# Patient Record
Sex: Male | Born: 1955 | Race: White | Hispanic: No | Marital: Single | Smoking: Current every day smoker
Health system: Southern US, Community
[De-identification: ages and names within clinical notes are randomized; demographics above are authoritative.]

## PROBLEM LIST (undated history)

## (undated) DIAGNOSIS — H905 Unspecified sensorineural hearing loss: Secondary | ICD-10-CM

## (undated) DIAGNOSIS — E785 Hyperlipidemia, unspecified: Secondary | ICD-10-CM

## (undated) DIAGNOSIS — M1990 Unspecified osteoarthritis, unspecified site: Secondary | ICD-10-CM

## (undated) HISTORY — DX: Hyperlipidemia, unspecified: E78.5

## (undated) HISTORY — DX: Unspecified osteoarthritis, unspecified site: M19.90

---

## 1998-04-29 HISTORY — PX: APPENDECTOMY: SHX54

## 1999-09-09 ENCOUNTER — Encounter: Payer: Self-pay | Admitting: *Deleted

## 1999-09-09 ENCOUNTER — Encounter (INDEPENDENT_AMBULATORY_CARE_PROVIDER_SITE_OTHER): Payer: Self-pay | Admitting: *Deleted

## 1999-09-09 ENCOUNTER — Inpatient Hospital Stay (HOSPITAL_COMMUNITY): Admission: EM | Admit: 1999-09-09 | Discharge: 1999-09-11 | Payer: Self-pay | Admitting: Emergency Medicine

## 2005-02-20 ENCOUNTER — Emergency Department (HOSPITAL_COMMUNITY): Admission: EM | Admit: 2005-02-20 | Discharge: 2005-02-20 | Payer: Self-pay | Admitting: Family Medicine

## 2007-07-26 ENCOUNTER — Emergency Department (HOSPITAL_COMMUNITY): Admission: EM | Admit: 2007-07-26 | Discharge: 2007-07-26 | Payer: Self-pay | Admitting: Emergency Medicine

## 2010-09-14 NOTE — H&P (Signed)
[name]. [facility]  Patient:    Antonio Carpenter, Antonio Carpenter                     MRN: [id number] Proc. Date: 09/09/99 Adm. Date:  09/09/99 Attending:  Sandria Bales. Ezzard Standing, M.D.                         History and Physical  DATE OF BIRTH:  ______ .  BRIEF HISTORY:  Mr. 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  He was from [place], [place] until he moved here last Fall.  The physician that he was seeing down there, died a year or two ago, and he has identified no physician.  Our communication was totally by handwritten notes, and I had nobody to interpret or sign.  According to the nurses, he never has signed for anybody around [place].  He apparently developed a lower abdominal pain earlier today.  A CT scan has revealed acute appendicitis.  As best I can tell, he has no history of gastrointestinal problems.  ALLERGIES:  He has no allergies.  CURRENT MEDICATIONS:  He is on no medications.  REVIEW OF SYSTEMS:  Basically unremarkable, though limited by what we can write in handwritten notes.  SOCIAL HISTORY:  He works for [employer].  PHYSICAL EXAMINATION:  VITAL SIGNS:  His temperature was 99.7.  It has been as high as 101.  His pulse was 88.  GENERAL:  He is a well-nourished, white male alert and cooperative.  HEENT:  Unremarkable.  He had lost his hearing as a child with a fever he said.  NECK:  Supple.  LUNGS:  Clear to auscultation.  HEART:  Regular rate and rhythm without murmur or rub.  ABDOMEN:  Bowel sounds present but slightly decreased.  He has tenderness in the right lower quadrant with guarding rebound consistent with appendicitis. He has no other mass or tenderness noted.  EXTREMITIES:  He moves extremities well.  LABORATORY DATA:  The labs that I have show a white blood count of 14,900, and 89% neutrophils.  His hemoglobin was 14.8, and hematocrit 43.3.   His electrolytes were all within normal limits.  IMPRESSION:  Acute appendicitis.  PLAN:  Appendectomy tonight.  I discussed with the patient.  I hand wrote a note that he needed an appendectomy, but really impractical until I explained to him all the pros and cons of the surgery. DD:  09/09/99 TD:  09/09/99 Job: [id number]

## 2010-09-14 NOTE — Op Note (Signed)
[place]  Patient:    Antonio Carpenter, MCMANAMAN                        MRN: [id number] Proc. Date: 09/10/99 Attending:  Sandria Bales. Ezzard Standing, M.D.                           Operative Report  PREOPERATIVE DIAGNOSIS:  Acute appendicitis.  POSTOPERATIVE DIAGNOSIS:  Acute appendicitis.  OPERATION:  Laparoscopic appendectomy.  SURGEON:  Sandria Bales. Ezzard Standing, M.D.  ANESTHESIA:  General endotracheal anesthesia.  ESTIMATED BLOOD LOSS:  Minimal.  INDICATION:  Mr. Sokolowski is a 55 year old deaf male who has no primary medical doctor.  He comes with a diagnosis of acute appendicitis.  Now comes for attempted laparoscopic appendectomy.  DESCRIPTION OF PROCEDURE:  The patient in the supine position with his arms strapped to his side.  Foley catheter in place.  Then 2 gm of cefotetan initiated at this procedure.  His abdomen was shaved, prepped with Betadine solution, and sterilely draped.  An infraumbilical incision was made with a 0 degree laparoscope inserted to a 12 mm Hasson trocar and then that was secured with a 0 Vicryl suture. Abdominal exploration revealed a gaseous bowel.  Liver unremarkable.  Two additional trocars were placed-a 5 mm Ethicon trocar in the right subcostal location and a 10 mm Ethicon trocar in the left lower quadrant location.  The appendix was identified and was noted to be down to the pelvic.  The brim was flipped up and I then was able to see down the mesentery to the appendix using a harmonic scalpel.  This was taken to the base of the appendix and stapled across the base of the appendix using the vascular staples of a 30 Endo-GIA.  The appendix was then removed.  The bag actually came apart.  I was able to get the appendix out intact without leaving any parts of the bag behind that I was aware of.  The portion was then removed.  He had bleeding from his left lower quadrant. I had to use an endocatch for one stitch of 2-0 Vicryl  suture.  The other two ports were then removed directly.  The skin was closed with a 5-0 Vicryl suture, painted with tinc of benzoin, Steri-Strips, and sterilely dressed.  The patient tolerated the procedure well and was transported to the recovery room in good condition. DD:  09/10/99 TD:  09/10/99 Job: [id number]

## 2013-06-23 ENCOUNTER — Ambulatory Visit: Payer: BC Managed Care – PPO

## 2013-06-23 ENCOUNTER — Ambulatory Visit: Payer: BC Managed Care – PPO | Attending: Orthopaedic Surgery | Admitting: Physical Therapy

## 2013-06-23 DIAGNOSIS — M542 Cervicalgia: Secondary | ICD-10-CM | POA: Insufficient documentation

## 2013-06-23 DIAGNOSIS — M6281 Muscle weakness (generalized): Secondary | ICD-10-CM | POA: Insufficient documentation

## 2013-06-23 DIAGNOSIS — IMO0001 Reserved for inherently not codable concepts without codable children: Secondary | ICD-10-CM | POA: Insufficient documentation

## 2013-06-30 ENCOUNTER — Ambulatory Visit: Payer: BC Managed Care – PPO | Attending: Orthopaedic Surgery

## 2013-06-30 DIAGNOSIS — M6281 Muscle weakness (generalized): Secondary | ICD-10-CM | POA: Diagnosis not present

## 2013-06-30 DIAGNOSIS — M542 Cervicalgia: Secondary | ICD-10-CM | POA: Diagnosis not present

## 2013-06-30 DIAGNOSIS — IMO0001 Reserved for inherently not codable concepts without codable children: Secondary | ICD-10-CM | POA: Diagnosis present

## 2013-07-02 ENCOUNTER — Ambulatory Visit: Payer: BC Managed Care – PPO

## 2013-07-02 DIAGNOSIS — IMO0001 Reserved for inherently not codable concepts without codable children: Secondary | ICD-10-CM | POA: Diagnosis not present

## 2013-07-05 ENCOUNTER — Ambulatory Visit: Payer: BC Managed Care – PPO | Admitting: Physical Therapy

## 2013-07-05 ENCOUNTER — Ambulatory Visit: Payer: BC Managed Care – PPO | Admitting: Rehabilitation

## 2013-07-05 DIAGNOSIS — IMO0001 Reserved for inherently not codable concepts without codable children: Secondary | ICD-10-CM | POA: Diagnosis not present

## 2013-07-07 ENCOUNTER — Other Ambulatory Visit (HOSPITAL_COMMUNITY): Payer: Self-pay | Admitting: Orthopaedic Surgery

## 2013-07-07 ENCOUNTER — Ambulatory Visit: Payer: BC Managed Care – PPO | Admitting: Physical Therapy

## 2013-07-07 DIAGNOSIS — IMO0001 Reserved for inherently not codable concepts without codable children: Secondary | ICD-10-CM | POA: Diagnosis not present

## 2013-07-12 ENCOUNTER — Ambulatory Visit: Payer: BC Managed Care – PPO | Admitting: Rehabilitation

## 2013-07-12 DIAGNOSIS — IMO0001 Reserved for inherently not codable concepts without codable children: Secondary | ICD-10-CM | POA: Diagnosis not present

## 2013-07-14 ENCOUNTER — Ambulatory Visit: Payer: BC Managed Care – PPO | Admitting: Rehabilitation

## 2013-07-14 DIAGNOSIS — IMO0001 Reserved for inherently not codable concepts without codable children: Secondary | ICD-10-CM | POA: Diagnosis not present

## 2013-07-19 ENCOUNTER — Ambulatory Visit: Payer: BC Managed Care – PPO | Admitting: Rehabilitation

## 2013-07-19 DIAGNOSIS — IMO0001 Reserved for inherently not codable concepts without codable children: Secondary | ICD-10-CM | POA: Diagnosis not present

## 2013-07-21 ENCOUNTER — Encounter: Payer: BC Managed Care – PPO | Admitting: Rehabilitation

## 2013-08-02 ENCOUNTER — Encounter (HOSPITAL_COMMUNITY)
Admission: RE | Admit: 2013-08-02 | Discharge: 2013-08-02 | Disposition: A | Discharge status: Home / Self Care | Payer: BC Managed Care – PPO | Source: Ambulatory Visit | Attending: Orthopaedic Surgery | Admitting: Orthopaedic Surgery

## 2013-08-02 ENCOUNTER — Encounter (HOSPITAL_COMMUNITY): Payer: Self-pay

## 2013-08-02 DIAGNOSIS — Z01818 Encounter for other preprocedural examination: Secondary | ICD-10-CM | POA: Insufficient documentation

## 2013-08-02 DIAGNOSIS — Z01812 Encounter for preprocedural laboratory examination: Secondary | ICD-10-CM | POA: Insufficient documentation

## 2013-08-02 DIAGNOSIS — Z01810 Encounter for preprocedural cardiovascular examination: Secondary | ICD-10-CM | POA: Insufficient documentation

## 2013-08-02 HISTORY — DX: Unspecified sensorineural hearing loss: H90.5

## 2013-08-02 LAB — COMPREHENSIVE METABOLIC PANEL
ALT: 15 U/L (ref 0–53)
AST: 15 U/L (ref 0–37)
Albumin: 4.1 g/dL (ref 3.5–5.2)
Alkaline Phosphatase: 80 U/L (ref 39–117)
BUN: 11 mg/dL (ref 6–23)
CO2: 25 mEq/L (ref 19–32)
Calcium: 9.1 mg/dL (ref 8.4–10.5)
Chloride: 102 mEq/L (ref 96–112)
Creatinine, Ser: 0.77 mg/dL (ref 0.50–1.35)
GFR calc Af Amer: >90 mL/min (ref >90)
GFR calc non Af Amer: >90 mL/min (ref >90)
Glucose, Bld: 109 mg/dL — ABNORMAL HIGH (ref 70–99)
Potassium: 4.1 mEq/L (ref 3.7–5.3)
Sodium: 143 mEq/L (ref 137–147)
Total Bilirubin: <0.2 mg/dL — ABNORMAL LOW (ref 0.3–1.2)
Total Protein: 7.4 g/dL (ref 6.0–8.3)

## 2013-08-02 LAB — URINALYSIS, ROUTINE W REFLEX MICROSCOPIC
Bilirubin Urine: NEGATIVE
Glucose, UA: NEGATIVE mg/dL
Hgb urine dipstick: NEGATIVE
Ketones, ur: NEGATIVE mg/dL
LEUKOCYTES UA: NEGATIVE
NITRITE: NEGATIVE
PROTEIN: NEGATIVE mg/dL
SPECIFIC GRAVITY, URINE: 1.017 (ref 1.005–1.030)
Urobilinogen, UA: 0.2 mg/dL (ref 0.0–1.0)
pH: 5 (ref 5.0–8.0)

## 2013-08-02 LAB — CBC
HCT: 39.5 % (ref 39.0–52.0)
Hemoglobin: 13.4 g/dL (ref 13.0–17.0)
MCH: 30.2 pg (ref 26.0–34.0)
MCHC: 33.9 g/dL (ref 30.0–36.0)
MCV: 89 fL (ref 78.0–100.0)
Platelets: 374 10*3/uL (ref 150–400)
RBC: 4.44 MIL/uL (ref 4.22–5.81)
RDW: 15 % (ref 11.5–15.5)
WBC: 9.2 10*3/uL (ref 4.0–10.5)

## 2013-08-02 LAB — PROTIME-INR
INR: 0.87 (ref 0.00–1.49)
PROTHROMBIN TIME: 11.7 s (ref 11.6–15.2)

## 2013-08-02 LAB — SURGICAL PCR SCREEN
MRSA, PCR: NEGATIVE
Staphylococcus aureus: NEGATIVE

## 2013-08-02 NOTE — Progress Notes (Signed)
Patient's PAT appointment was completed with assistance of sign language interpreter.

## 2013-08-02 NOTE — Progress Notes (Signed)
Primary physician - dr.  Merideth Abbey Erenest Blank) Does not have cardiologist Had stress test more than 10 years ago - was normal no testing since then

## 2013-08-02 NOTE — Pre-Procedure Instructions (Signed)
Antonio Carpenter  08/02/2013   Your procedure is scheduled on:  Wednesday, April 15th  Report to Admitting at 1030 AM.  Call this number if you have problems the morning of surgery: [phone]   Remember:   Do not eat food or drink liquids after midnight.   Take these medicines the morning of surgery with A SIP OF WATER:    Do not wear jewelry.  Do not wear lotions, powders, or perfumes. You may wear deodorant.  Do not shave 48 hours prior to surgery. Men may shave face and neck.  Do not bring valuables to the hospital.  [facility] is not responsible   for any belongings or valuables.               Contacts, dentures or bridgework may not be worn into surgery.  Leave suitcase in the car. After surgery it may be brought to your room.  For patients admitted to the hospital, discharge time is determined by your  treatment team.               Patients discharged the day of surgery will not be allowed to drive home.  Please read over the following fact sheets that you were given: Pain Booklet, Coughing and Deep Breathing, MRSA Information and Surgical Site Infection Prevention [facility] - Preparing for Surgery  Before surgery, you can play an important role.  Because skin is not sterile, your skin needs to be as free of germs as possible.  You can reduce the number of germs on you skin by washing with CHG (chlorahexidine gluconate) soap before surgery.  CHG is an antiseptic cleaner which kills germs and bonds with the skin to continue killing germs even after washing.  Please DO NOT use if you have an allergy to CHG or antibacterial soaps.  If your skin becomes reddened/irritated stop using the CHG and inform your nurse when you arrive at Short Stay.  Do not shave (including legs and underarms) for at least 48 hours prior to the first CHG shower.  You may shave your face.  Please follow these instructions carefully:   1.  Shower with CHG Soap the night before surgery and the morning of  Surgery.  2.  If you choose to wash your hair, wash your hair first as usual with your normal shampoo.  3.  After you shampoo, rinse your hair and body thoroughly to remove the shampoo.  4.  Use CHG as you would any other liquid soap.  You can apply CHG directly to the skin and wash gently with scrungie or a clean washcloth.  5.  Apply the CHG Soap to your body ONLY FROM THE NECK DOWN.  Do not use on open wounds or open sores.  Avoid contact with your eyes, ears, mouth and genitals (private parts).  Wash genitals (private parts) with your normal soap.  6.  Wash thoroughly, paying special attention to the area where your surgery will be performed.  7.  Thoroughly rinse your body with warm water from the neck down.  8.  DO NOT shower/wash with your normal soap after using and rinsing off the CHG Soap.  9.  Pat yourself dry with a clean towel.            10.  Wear clean pajamas.            11.  Place clean sheets on your bed the night of your first shower and do not sleep with  pets.  Day of Surgery  Do not apply any lotions/deoderants the morning of surgery.  Please wear clean clothes to the hospital/surgery center.

## 2013-08-06 NOTE — H&P (Signed)
[facility]   A Division of [employer]   [address] [place], [place] [address] Telephone: [phone]  Fax: [phone]     PATIENT: Antonio Carpenter, Antonio Carpenter   MR#: [id number]  DOB: 04/12/1956       A 58 year old returns with persistent problems with cervical spine pain.  He has problems with bilateral hand stiffness.  He has more numbness in the radial fingers than the ulnar.  Continues to have problems with neck pain.  He states the hands feel weak enough that he has not been able to work as planned.  He has gotten some temporary relief when he took the Medrol Dosepak repetitively.  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  There is some additional foraminal narrowing at C4-5 and C6-7, moderate to severe at C6-7 on the left, but his symptoms have primarily been bilateral arm numbness and tingling with difficulty with gripping.  He has been through therapy, has had several months of modified activity, and has not gotten better.  He denies any lower extremity numbness or tingling problems, only upper extremities, neck, and his shoulders and hands.   PAST SURGICAL HISTORY:  Includes appendectomy 2008.   SOCIAL HISTORY:  Patient is single, works in an [employer].  He is a 1/2-pack-per-day smoker x20 years.  Does not drink.  He is deaf.   PHYSICAL EXAMINATION:  Patient is 5 feet 7 inches, 180 pounds.  Alert and oriented. Heart and lung sounds clear.  Abdomen soft .  Bilateral brachial plexus tenderness, both right and left.  He has 2+ biceps and triceps reflexes, mild brachial plexus tenderness.  Interossei are normal.  He lacks about 1 fingerbreadth touching fingertips to palm symmetrically with stiffness.  No triggering over the A1 pulley.  Mild DIP osteophyte formation of the digits.   We reviewed patient's MRI once again.  This shows  his tightest level is at the C3-4 level.  He has a straight cervical spine with loss of lordosis and anterior spurring at C5-6 and C6-7.  Patient did well with the Medrol Dosepak for a short, temporary time and then had recurrence of symptoms.   PLAN:  The plan would be a single-level diskectomy C3-4, anterior cervical diskectomy and fusion, allograft and plate.  Eight weeks out of work.  All questions answered.  Risks of surgery include dysphagia, dysphonia, hoarseness, re-operation, pseudarthrosis.       Mark C. Lorin Mercy, M.D.    Auto-Authenticated by Thana Farr. Lorin Mercy, M.D.

## 2013-08-10 MED ORDER — CEFAZOLIN SODIUM-DEXTROSE 2-3 GM-% IV SOLR
2.0000 g | INTRAVENOUS | Status: AC
  Administered 2013-08-11: 2 g via INTRAVENOUS
  Filled 2013-08-10: qty 50

## 2013-08-11 ENCOUNTER — Inpatient Hospital Stay (HOSPITAL_COMMUNITY)
Admission: RE | Admit: 2013-08-11 | Discharge: 2013-08-12 | DRG: 473 | Disposition: A | Discharge status: Home / Self Care | Payer: BC Managed Care – PPO | Source: Ambulatory Visit | Attending: Orthopaedic Surgery | Admitting: Orthopaedic Surgery

## 2013-08-11 ENCOUNTER — Encounter (HOSPITAL_COMMUNITY)
Admission: RE | Disposition: A | Discharge status: Home / Self Care | Payer: Self-pay | Source: Ambulatory Visit | Attending: Orthopaedic Surgery

## 2013-08-11 ENCOUNTER — Ambulatory Visit (HOSPITAL_COMMUNITY): Payer: BC Managed Care – PPO | Admitting: Anesthesiology

## 2013-08-11 ENCOUNTER — Encounter (HOSPITAL_COMMUNITY): Payer: Self-pay | Admitting: *Deleted

## 2013-08-11 ENCOUNTER — Ambulatory Visit (HOSPITAL_COMMUNITY): Payer: BC Managed Care – PPO

## 2013-08-11 ENCOUNTER — Encounter (HOSPITAL_COMMUNITY): Payer: BC Managed Care – PPO | Admitting: Anesthesiology

## 2013-08-11 DIAGNOSIS — M47812 Spondylosis without myelopathy or radiculopathy, cervical region: Secondary | ICD-10-CM | POA: Diagnosis present

## 2013-08-11 DIAGNOSIS — F17200 Nicotine dependence, unspecified, uncomplicated: Secondary | ICD-10-CM | POA: Diagnosis present

## 2013-08-11 DIAGNOSIS — M5020 Other cervical disc displacement, unspecified cervical region: Secondary | ICD-10-CM | POA: Diagnosis present

## 2013-08-11 DIAGNOSIS — H9190 Unspecified hearing loss, unspecified ear: Secondary | ICD-10-CM | POA: Diagnosis present

## 2013-08-11 HISTORY — PX: ANTERIOR CERVICAL DECOMP/DISCECTOMY FUSION: SHX1161

## 2013-08-11 SURGERY — ANTERIOR CERVICAL DECOMPRESSION/DISCECTOMY FUSION 1 LEVEL
Anesthesia: General | Site: Neck

## 2013-08-11 MED ORDER — FLEET ENEMA 7-19 GM/118ML RE ENEM: 1.0000 | ENEMA | Freq: Once | RECTAL | Status: AC | PRN

## 2013-08-11 MED ORDER — METHOCARBAMOL 500 MG PO TABS: 500.0000 mg | ORAL_TABLET | Freq: Four times a day (QID) | ORAL | Status: DC | PRN

## 2013-08-11 MED ORDER — PROPOFOL 10 MG/ML IV BOLUS
INTRAVENOUS | Status: DC | PRN
  Administered 2013-08-11: 200 mg via INTRAVENOUS

## 2013-08-11 MED ORDER — KETOROLAC TROMETHAMINE 30 MG/ML IJ SOLN
30.0000 mg | Freq: Once | INTRAMUSCULAR | Status: AC
  Administered 2013-08-11: 30 mg via INTRAVENOUS
  Filled 2013-08-11: qty 1

## 2013-08-11 MED ORDER — KCL IN DEXTROSE-NACL 20-5-0.45 MEQ/L-%-% IV SOLN
INTRAVENOUS | Status: DC
  Administered 2013-08-11: 75 mL/h via INTRAVENOUS
  Filled 2013-08-11 (×3): qty 1000

## 2013-08-11 MED ORDER — HYDROMORPHONE HCL PF 1 MG/ML IJ SOLN
0.2500 mg | INTRAMUSCULAR | Status: DC | PRN
  Administered 2013-08-11 (×2): 0.5 mg via INTRAVENOUS

## 2013-08-11 MED ORDER — SODIUM CHLORIDE 0.9 % IJ SOLN
3.0000 mL | Freq: Two times a day (BID) | INTRAMUSCULAR | Status: DC
  Administered 2013-08-11: 3 mL via INTRAVENOUS

## 2013-08-11 MED ORDER — LIDOCAINE HCL (CARDIAC) 20 MG/ML IV SOLN
INTRAVENOUS | Status: DC | PRN
  Administered 2013-08-11: 60 mg via INTRAVENOUS

## 2013-08-11 MED ORDER — ACETAMINOPHEN 650 MG RE SUPP: 650.0000 mg | RECTAL | Status: DC | PRN

## 2013-08-11 MED ORDER — HYDROCODONE-ACETAMINOPHEN 5-325 MG PO TABS
1.0000 | ORAL_TABLET | ORAL | Status: DC | PRN
  Administered 2013-08-11 – 2013-08-12 (×2): 2 via ORAL
  Filled 2013-08-11 (×2): qty 2

## 2013-08-11 MED ORDER — OXYCODONE HCL 5 MG/5ML PO SOLN: 5.0000 mg | Freq: Once | ORAL | Status: DC | PRN

## 2013-08-11 MED ORDER — PHENOL 1.4 % MT LIQD: 1.0000 | OROMUCOSAL | Status: DC | PRN

## 2013-08-11 MED ORDER — LACTATED RINGERS IV SOLN
INTRAVENOUS | Status: DC
  Administered 2013-08-11: 11:00:00 via INTRAVENOUS

## 2013-08-11 MED ORDER — OXYCODONE HCL 5 MG PO TABS: 5.0000 mg | ORAL_TABLET | Freq: Once | ORAL | Status: DC | PRN

## 2013-08-11 MED ORDER — METHOCARBAMOL 100 MG/ML IJ SOLN
500.0000 mg | Freq: Four times a day (QID) | INTRAVENOUS | Status: DC | PRN
  Filled 2013-08-11: qty 5

## 2013-08-11 MED ORDER — HEMOSTATIC AGENTS (NO CHARGE) OPTIME
TOPICAL | Status: DC | PRN
  Administered 2013-08-11: 1 via TOPICAL

## 2013-08-11 MED ORDER — METHOCARBAMOL 500 MG PO TABS: 500.0000 mg | ORAL_TABLET | Freq: Four times a day (QID) | ORAL | Status: AC | PRN

## 2013-08-11 MED ORDER — OXYCODONE-ACETAMINOPHEN 5-325 MG PO TABS: 1.0000 | ORAL_TABLET | ORAL | Status: AC | PRN

## 2013-08-11 MED ORDER — FENTANYL CITRATE 0.05 MG/ML IJ SOLN
INTRAMUSCULAR | Status: DC | PRN
  Administered 2013-08-11 (×2): 50 ug via INTRAVENOUS
  Administered 2013-08-11: 150 ug via INTRAVENOUS

## 2013-08-11 MED ORDER — PROPOFOL 10 MG/ML IV BOLUS
INTRAVENOUS | Status: AC
  Filled 2013-08-11: qty 20

## 2013-08-11 MED ORDER — ACETAMINOPHEN 325 MG PO TABS: 650.0000 mg | ORAL_TABLET | ORAL | Status: DC | PRN

## 2013-08-11 MED ORDER — LACTATED RINGERS IV SOLN
INTRAVENOUS | Status: DC | PRN
  Administered 2013-08-11: 11:00:00 via INTRAVENOUS

## 2013-08-11 MED ORDER — BUPIVACAINE-EPINEPHRINE (PF) 0.5% -1:200000 IJ SOLN
INTRAMUSCULAR | Status: AC
  Filled 2013-08-11: qty 10

## 2013-08-11 MED ORDER — ROCURONIUM BROMIDE 100 MG/10ML IV SOLN
INTRAVENOUS | Status: DC | PRN
  Administered 2013-08-11: 50 mg via INTRAVENOUS
  Administered 2013-08-11: 10 mg via INTRAVENOUS

## 2013-08-11 MED ORDER — FENTANYL CITRATE 0.05 MG/ML IJ SOLN
INTRAMUSCULAR | Status: AC
  Filled 2013-08-11: qty 5

## 2013-08-11 MED ORDER — 0.9 % SODIUM CHLORIDE (POUR BTL) OPTIME
TOPICAL | Status: DC | PRN
  Administered 2013-08-11: 1000 mL

## 2013-08-11 MED ORDER — LIDOCAINE HCL (CARDIAC) 20 MG/ML IV SOLN
INTRAVENOUS | Status: AC
  Filled 2013-08-11: qty 5

## 2013-08-11 MED ORDER — MIDAZOLAM HCL 2 MG/2ML IJ SOLN
INTRAMUSCULAR | Status: AC
  Filled 2013-08-11: qty 2

## 2013-08-11 MED ORDER — PHENYLEPHRINE HCL 10 MG/ML IJ SOLN
10.0000 mg | INTRAVENOUS | Status: DC | PRN
  Administered 2013-08-11: 10 ug/min via INTRAVENOUS

## 2013-08-11 MED ORDER — THROMBIN 5000 UNITS EX SOLR
CUTANEOUS | Status: AC
  Filled 2013-08-11: qty 5000

## 2013-08-11 MED ORDER — THROMBIN 5000 UNITS EX SOLR
CUTANEOUS | Status: DC | PRN
  Administered 2013-08-11: 5000 [IU] via TOPICAL

## 2013-08-11 MED ORDER — PROMETHAZINE HCL 25 MG/ML IJ SOLN: 6.2500 mg | INTRAMUSCULAR | Status: DC | PRN

## 2013-08-11 MED ORDER — ONDANSETRON HCL 4 MG/2ML IJ SOLN: 4.0000 mg | INTRAMUSCULAR | Status: DC | PRN

## 2013-08-11 MED ORDER — NEOSTIGMINE METHYLSULFATE 1 MG/ML IJ SOLN
INTRAMUSCULAR | Status: DC | PRN
  Administered 2013-08-11: 4 mg via INTRAVENOUS

## 2013-08-11 MED ORDER — GLYCOPYRROLATE 0.2 MG/ML IJ SOLN
INTRAMUSCULAR | Status: DC | PRN
  Administered 2013-08-11: 0.6 mg via INTRAVENOUS

## 2013-08-11 MED ORDER — MIDAZOLAM HCL 5 MG/5ML IJ SOLN
INTRAMUSCULAR | Status: DC | PRN
  Administered 2013-08-11: 2 mg via INTRAVENOUS

## 2013-08-11 MED ORDER — HYDROMORPHONE HCL PF 1 MG/ML IJ SOLN
INTRAMUSCULAR | Status: AC
  Administered 2013-08-11: 0.5 mg via INTRAVENOUS
  Filled 2013-08-11: qty 1

## 2013-08-11 MED ORDER — MORPHINE SULFATE 2 MG/ML IJ SOLN: 1.0000 mg | INTRAMUSCULAR | Status: DC | PRN

## 2013-08-11 MED ORDER — SODIUM CHLORIDE 0.9 % IJ SOLN
3.0000 mL | INTRAMUSCULAR | Status: DC | PRN
  Administered 2013-08-11: 3 mL via INTRAVENOUS

## 2013-08-11 MED ORDER — PANTOPRAZOLE SODIUM 40 MG IV SOLR
40.0000 mg | Freq: Every day | INTRAVENOUS | Status: DC
  Administered 2013-08-11: 40 mg via INTRAVENOUS
  Filled 2013-08-11 (×2): qty 40

## 2013-08-11 MED ORDER — MENTHOL 3 MG MT LOZG
1.0000 | LOZENGE | OROMUCOSAL | Status: DC | PRN
  Filled 2013-08-11 (×3): qty 9

## 2013-08-11 MED ORDER — THROMBIN 20000 UNITS EX KIT
PACK | CUTANEOUS | Status: AC
  Filled 2013-08-11: qty 1

## 2013-08-11 MED ORDER — SODIUM CHLORIDE 0.9 % IV SOLN: 250.0000 mL | INTRAVENOUS | Status: DC

## 2013-08-11 MED ORDER — DOCUSATE SODIUM 100 MG PO CAPS
100.0000 mg | ORAL_CAPSULE | Freq: Two times a day (BID) | ORAL | Status: DC
  Administered 2013-08-11: 100 mg via ORAL
  Filled 2013-08-11 (×3): qty 1

## 2013-08-11 MED ORDER — ONDANSETRON HCL 4 MG/2ML IJ SOLN
INTRAMUSCULAR | Status: DC | PRN
  Administered 2013-08-11: 4 mg via INTRAVENOUS

## 2013-08-11 MED ORDER — OXYCODONE-ACETAMINOPHEN 5-325 MG PO TABS
1.0000 | ORAL_TABLET | ORAL | Status: DC | PRN
  Administered 2013-08-12: 2 via ORAL
  Filled 2013-08-11: qty 2

## 2013-08-11 MED ORDER — BISACODYL 10 MG RE SUPP: 10.0000 mg | Freq: Every day | RECTAL | Status: DC | PRN

## 2013-08-11 MED ORDER — SENNOSIDES-DOCUSATE SODIUM 8.6-50 MG PO TABS: 1.0000 | ORAL_TABLET | Freq: Every evening | ORAL | Status: DC | PRN

## 2013-08-11 MED ORDER — PHENYLEPHRINE HCL 10 MG/ML IJ SOLN
INTRAMUSCULAR | Status: DC | PRN
  Administered 2013-08-11: 40 ug via INTRAVENOUS

## 2013-08-11 SURGICAL SUPPLY — 63 items
ADH SKN CLS APL DERMABOND .7 (GAUZE/BANDAGES/DRESSINGS) ×1
APL SKNCLS STERI-STRIP NONHPOA (GAUZE/BANDAGES/DRESSINGS) ×1
BENZOIN TINCTURE PRP APPL 2/3 (GAUZE/BANDAGES/DRESSINGS) ×2 IMPLANT
BIT DRILL SRG 14X2.2XFLT CHK (BIT) IMPLANT
BIT DRL SRG 14X2.2XFLT CHK (BIT) ×1
BLADE SURG ROTATE 9660 (MISCELLANEOUS) IMPLANT
BONE CERV LORDOTIC 14.5X12X7 (Bone Implant) ×3 IMPLANT
BUR ROUND FLUTED 4 SOFT TCH (BURR) ×1 IMPLANT
BUR ROUND FLUTED 4MM SOFT TCH (BURR) ×1
CLOSURE STERI-STRIP 1/2X4 (GAUZE/BANDAGES/DRESSINGS) ×1
CLSR STERI-STRIP ANTIMIC 1/2X4 (GAUZE/BANDAGES/DRESSINGS) ×1 IMPLANT
COLLAR CERV LO CONTOUR FIRM DE (SOFTGOODS) ×3 IMPLANT
CORDS BIPOLAR (ELECTRODE) IMPLANT
COVER MAYO STAND STRL (DRAPES) ×3 IMPLANT
COVER SURGICAL LIGHT HANDLE (MISCELLANEOUS) ×3 IMPLANT
DERMABOND ADVANCED (GAUZE/BANDAGES/DRESSINGS) ×2
DERMABOND ADVANCED .7 DNX12 (GAUZE/BANDAGES/DRESSINGS) ×1 IMPLANT
DRAPE C-ARM 42X72 X-RAY (DRAPES) ×3 IMPLANT
DRAPE MICROSCOPE LEICA (MISCELLANEOUS) ×3 IMPLANT
DRAPE PROXIMA HALF (DRAPES) ×3 IMPLANT
DRILL BIT SKYLINE 14MM (BIT) ×3
DRSG MEPILEX BORDER 4X4 (GAUZE/BANDAGES/DRESSINGS) ×3 IMPLANT
DRSG MEPILEX BORDER 4X8 (GAUZE/BANDAGES/DRESSINGS) ×3 IMPLANT
DURAPREP 6ML APPLICATOR 50/CS (WOUND CARE) ×3 IMPLANT
ELECT COATED BLADE 2.86 ST (ELECTRODE) ×3 IMPLANT
ELECT REM PT RETURN 9FT ADLT (ELECTROSURGICAL) ×3
ELECTRODE REM PT RTRN 9FT ADLT (ELECTROSURGICAL) ×1 IMPLANT
EVACUATOR 1/8 PVC DRAIN (DRAIN) ×3 IMPLANT
GAUZE XEROFORM 1X8 LF (GAUZE/BANDAGES/DRESSINGS) ×6 IMPLANT
GLOVE BIOGEL PI IND STRL 7.5 (GLOVE) ×1 IMPLANT
GLOVE BIOGEL PI IND STRL 8 (GLOVE) ×1 IMPLANT
GLOVE BIOGEL PI INDICATOR 7.5 (GLOVE) ×2
GLOVE BIOGEL PI INDICATOR 8 (GLOVE) ×2
GLOVE ECLIPSE 7.0 STRL STRAW (GLOVE) ×3 IMPLANT
GLOVE ORTHO TXT STRL SZ7.5 (GLOVE) ×3 IMPLANT
GOWN STRL REUS W/ TWL LRG LVL3 (GOWN DISPOSABLE) ×2 IMPLANT
GOWN STRL REUS W/ TWL XL LVL3 (GOWN DISPOSABLE) ×1 IMPLANT
GOWN STRL REUS W/TWL LRG LVL3 (GOWN DISPOSABLE) ×6
GOWN STRL REUS W/TWL XL LVL3 (GOWN DISPOSABLE) ×3
GRAFT BNE SPCR VG2 14.5X12X7 (Bone Implant) IMPLANT
HEAD HALTER (SOFTGOODS) ×3 IMPLANT
HEMOSTAT SURGICEL 2X14 (HEMOSTASIS) IMPLANT
KIT BASIN OR (CUSTOM PROCEDURE TRAY) ×3 IMPLANT
KIT ROOM TURNOVER OR (KITS) ×3 IMPLANT
MANIFOLD NEPTUNE II (INSTRUMENTS) ×3 IMPLANT
NDL 25GX 5/8IN NON SAFETY (NEEDLE) ×1 IMPLANT
NEEDLE 25GX 5/8IN NON SAFETY (NEEDLE) ×3 IMPLANT
NS IRRIG 1000ML POUR BTL (IV SOLUTION) ×3 IMPLANT
PACK ORTHO CERVICAL (CUSTOM PROCEDURE TRAY) ×3 IMPLANT
PAD ARMBOARD 7.5X6 YLW CONV (MISCELLANEOUS) ×6 IMPLANT
PATTIES SURGICAL .5 X.5 (GAUZE/BANDAGES/DRESSINGS) IMPLANT
PLATE ONE LEVEL SKYLINE 14MM (Plate) ×2 IMPLANT
SCREW VAR SELF TAP SKYLINE 14M (Screw) ×8 IMPLANT
SPONGE GAUZE 4X4 12PLY (GAUZE/BANDAGES/DRESSINGS) ×3 IMPLANT
SPONGE SURGIFOAM ABS GEL 100 (HEMOSTASIS) ×2 IMPLANT
SURGIFLO TRUKIT (HEMOSTASIS) IMPLANT
SUT VIC AB 3-0 X1 27 (SUTURE) ×3 IMPLANT
SUT VICRYL 4-0 PS2 18IN ABS (SUTURE) ×6 IMPLANT
SYR 30ML SLIP (SYRINGE) ×3 IMPLANT
SYR BULB 3OZ (MISCELLANEOUS) ×3 IMPLANT
TOWEL OR 17X24 6PK STRL BLUE (TOWEL DISPOSABLE) ×3 IMPLANT
TOWEL OR 17X26 10 PK STRL BLUE (TOWEL DISPOSABLE) ×3 IMPLANT
WATER STERILE IRR 1000ML POUR (IV SOLUTION) ×3 IMPLANT

## 2013-08-11 NOTE — Discharge Instructions (Signed)
No lifting greater than 10 lbs. No overhead use of arms. °Avoid bending,and twisting neck. °Walk in house for first week them may start to get out slowly increasing distance up to one mile by 3 weeks post op. °Keep incision dry for 3 days, may then bathe and wet incision using a covered collar when showering. °Call if any fevers >101, chills, or increasing numbness or weakness or increased swelling or drainage. ° °

## 2013-08-11 NOTE — Progress Notes (Signed)
Pt is totally deaf sign language lady here with pt to ask pt questions and explain to pt

## 2013-08-11 NOTE — Anesthesia Postprocedure Evaluation (Signed)
Anesthesia Post Note  Patient: Antonio Carpenter  Procedure(s) Performed: Procedure(s) (LRB): ANTERIOR CERVICAL DECOMPRESSION/DISCECTOMY FUSION 1 LEVEL (N/A)  Anesthesia type: general  Patient location: PACU  Post pain: Pain level controlled  Post assessment: Patient's Cardiovascular Status Stable  Last Vitals:  Filed Vitals:   08/11/13 1515  BP: 135/85  Pulse: 66  Temp: 36.8 C  Resp: 11    Post vital signs: Reviewed and stable  Level of consciousness: sedated  Complications: No apparent anesthesia complications

## 2013-08-11 NOTE — Transfer of Care (Signed)
Immediate Anesthesia Transfer of Care Note  Patient: Antonio Carpenter  Procedure(s) Performed: Procedure(s) with comments: ANTERIOR CERVICAL DECOMPRESSION/DISCECTOMY FUSION 1 LEVEL (N/A) - C3-4 Anterior Cervical Discectomy and Fusion, Allograft and Plate  Patient Location: PACU  Anesthesia Type:General  Level of Consciousness: awake  Airway & Oxygen Therapy: Patient Spontanous Breathing and Patient connected to nasal cannula oxygen  Post-op Assessment: Report given to PACU RN, Post -op Vital signs reviewed and stable and Patient moving all extremities  Post vital signs: Reviewed and stable  Complications: No apparent anesthesia complications

## 2013-08-11 NOTE — Anesthesia Procedure Notes (Addendum)
Procedure Name: Intubation Date/Time: 08/11/2013 12:40 PM Performed by: Maeola Harman Pre-anesthesia Checklist: Patient identified, Emergency Drugs available, Patient being monitored, Suction available and Timeout performed Patient Re-evaluated:Patient Re-evaluated prior to inductionOxygen Delivery Method: Circle system utilized Preoxygenation: Pre-oxygenation with 100% oxygen Intubation Type: IV induction Ventilation: Mask ventilation without difficulty and Oral airway inserted - appropriate to patient size Laryngoscope Size: Mac and 3 Grade View: Grade I Tube type: Oral Tube size: 7.5 mm Number of attempts: 1 Airway Equipment and Method: Stylet Placement Confirmation: ETT inserted through vocal cords under direct vision,  positive ETCO2 and breath sounds checked- equal and bilateral Secured at: 22 cm Tube secured with: Tape Dental Injury: Teeth and Oropharynx as per pre-operative assessment  Comments: Easy atraumatic induction and intubation with MAC 3 blade.  Dr. Tobias Alexander verified placement of ETT.  Antonio Session, CRNA

## 2013-08-11 NOTE — Interval H&P Note (Signed)
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 After consideration of risks, benefits and other options for treatment, the patient has consented to  Procedure(s) with comments: ANTERIOR CERVICAL DECOMPRESSION/DISCECTOMY FUSION 1 LEVEL (N/A) - C3-4 Anterior Cervical Discectomy and Fusion, Allograft and Plate as a surgical intervention .  The patient's history has been reviewed, patient examined, no change in status, stable for surgery.  I have reviewed the patient's chart and labs.  Questions were answered to the patient's satisfaction.     Marybelle Killings

## 2013-08-11 NOTE — Progress Notes (Signed)
Orthopedic Tech Progress Note Patient Details:  Antonio Carpenter [birth year] [id number] Patient has soft collar Patient ID: Antonio Carpenter, male   DOB: 12/29/1955, 58 y.o.   MRN: [id number]   [name] 08/11/2013, 4:56 PM

## 2013-08-11 NOTE — Progress Notes (Signed)
Utilization review completed.  

## 2013-08-11 NOTE — Progress Notes (Signed)
Paper and pen at patient bedside for communication.  Interpreter will be at bedside at 7am in the morning when MD makes rounds.

## 2013-08-11 NOTE — Anesthesia Preprocedure Evaluation (Addendum)
Anesthesia Evaluation  Patient identified by MRN, date of birth, ID band Patient awake    Reviewed: Allergy & Precautions, H&P , NPO status , Patient's Chart, lab work & pertinent test results  History of Anesthesia Complications Negative for: history of anesthetic complications  Airway Mallampati: II TM Distance: >3 FB Neck ROM: Limited    Dental  (+) Missing, Dental Advisory Given, Poor Dentition,    Pulmonary Current Smoker,  breath sounds clear to auscultation        Cardiovascular negative cardio ROS  Rhythm:Regular     Neuro/Psych negative neurological ROS  negative psych ROS   GI/Hepatic negative GI ROS, Neg liver ROS,   Endo/Other  negative endocrine ROS  Renal/GU negative Renal ROS     Musculoskeletal   Abdominal (+)  Abdomen: soft. Bowel sounds: normal.  Peds  Hematology   Anesthesia Other Findings Pt is deaf, interpreter at bedside.    Reproductive/Obstetrics negative OB ROS                       Anesthesia Physical Anesthesia Plan  ASA: II  Anesthesia Plan: General   Post-op Pain Management:    Induction: Intravenous  Airway Management Planned: Oral ETT  Additional Equipment:   Intra-op Plan:   Post-operative Plan: Extubation in OR  Informed Consent:   Plan Discussed with: CRNA, Anesthesiologist and Surgeon  Anesthesia Plan Comments:         Anesthesia Quick Evaluation

## 2013-08-11 NOTE — Brief Op Note (Cosign Needed)
08/11/2013  2:24 PM  PATIENT:  Antonio Carpenter  58 y.o. 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, HNP  PROCEDURE:  Procedure(s) with comments: ANTERIOR CERVICAL DECOMPRESSION/DISCECTOMY FUSION 1 LEVEL (N/A) - C3-4 Anterior Cervical Discectomy and Fusion, Allograft and Plate  SURGEON:  Surgeon(s) and Role:    * Marybelle Killings, MD - Primary  PHYSICIAN ASSISTANT: [facility]  ASSISTANTS: none   ANESTHESIA:   general  EBL:  Total I/O In: 1000 [I.V.:1000] Out: 200 [Blood:200]  BLOOD ADMINISTERED:none  DRAINS: (1) Hemovact drain(s) in the anterior neck with  Suction Open   LOCAL MEDICATIONS USED:  MARCAINE     SPECIMEN:  No Specimen  DISPOSITION OF SPECIMEN:  N/A  COUNTS:  YES  TOURNIQUET:  * No tourniquets in log *  DICTATION: .Note written in EPIC  PLAN OF CARE: Admit to inpatient   PATIENT DISPOSITION:  PACU - hemodynamically stable.   Delay start of Pharmacological VTE agent (>24hrs) due to surgical blood loss or risk of bleeding: yes

## 2013-08-12 ENCOUNTER — Encounter (HOSPITAL_COMMUNITY): Payer: Self-pay | Admitting: Orthopaedic Surgery

## 2013-08-12 NOTE — Progress Notes (Signed)
Subjective: 1 Day Post-Op Procedure(s) (LRB): ANTERIOR CERVICAL DECOMPRESSION/DISCECTOMY FUSION 1 LEVEL (N/A) Patient reports pain as mild.  Patient writes he is doing well and gets up out of bed moving arms and hands.  Objective: Vital signs in last 24 hours: Temp:  [98 F (36.7 C)-98.7 F (37.1 C)] 98 F (36.7 C) (04/16 0630) Pulse Rate:  [64-82] 64 (04/16 0630) Resp:  [11-18] 18 (04/16 0630) BP: (128-137)/(67-85) 132/76 mmHg (04/16 0630) SpO2:  [91 %-100 %] 99 % (04/16 0630) Weight:  [79.9 kg (176 lb 2.4 oz)] 79.9 kg (176 lb 2.4 oz) (04/15 1019)  Intake/Output from previous day: 04/15 0701 - 04/16 0700 In: 1391 [P.O.:360; I.V.:1006] Out: 200 [Blood:200] Intake/Output this shift:    No results found for this basename: HGB,  in the last 72 hours No results found for this basename: WBC, RBC, HCT, PLT,  in the last 72 hours No results found for this basename: NA, K, CL, CO2, BUN, CREATININE, GLUCOSE, CALCIUM,  in the last 72 hours No results found for this basename: LABPT, INR,  in the last 72 hours  Neurovascular intact Incision: scant drainage Hemovac in place  Assessment/Plan: 1 Day Post-Op Procedure(s) (LRB): ANTERIOR CERVICAL DECOMPRESSION/DISCECTOMY FUSION 1 LEVEL (N/A) Discharge to home  Hemovac pulled dressing changed. Questions answered and post-op instructions reviewed with patient by Dr. Ninfa Linden using pen and paper for communication.  Erskine Emery 08/12/2013, 9:54 AM

## 2013-08-12 NOTE — Plan of Care (Signed)
Problem: Consults Goal: Diagnosis - Spinal Surgery Cervical Spine Fusion C3-4 ACDF

## 2013-08-12 NOTE — Progress Notes (Addendum)
Subjective: 1 Day Post-Op Procedure(s) (LRB): ANTERIOR CERVICAL DECOMPRESSION/DISCECTOMY FUSION 1 LEVEL (N/A) Patient reports pain as mild.    Objective: Vital signs in last 24 hours: Temp:  [98 F (36.7 C)-98.7 F (37.1 C)] 98 F (36.7 C) (04/16 0630) Pulse Rate:  [64-82] 64 (04/16 0630) Resp:  [11-18] 18 (04/16 0630) BP: (128-137)/(67-85) 132/76 mmHg (04/16 0630) SpO2:  [91 %-100 %] 99 % (04/16 0630) Weight:  [79.9 kg (176 lb 2.4 oz)] 79.9 kg (176 lb 2.4 oz) (04/15 1019)  Intake/Output from previous day: 04/15 0701 - 04/16 0700 In: 1391 [P.O.:360; I.V.:1006] Out: 200 [Blood:200] Intake/Output this shift:    No results found for this basename: HGB,  in the last 72 hours No results found for this basename: WBC, RBC, HCT, PLT,  in the last 72 hours No results found for this basename: NA, K, CL, CO2, BUN, CREATININE, GLUCOSE, CALCIUM,  in the last 72 hours No results found for this basename: LABPT, INR,  in the last 72 hours  Neurologically intact  Assessment/Plan: 1 Day Post-Op Procedure(s) (LRB): ANTERIOR CERVICAL DECOMPRESSION/DISCECTOMY FUSION 1 LEVEL (N/A) Plan: dressing change, drain removal . Discharge, office followup one week. Rx on chart Dr. Ninfa Linden to see pt shortly for discharge.  Antonio Carpenter 08/12/2013, 9:13 AM

## 2013-08-12 NOTE — Op Note (Signed)
NAMEKAESON, KLEINERT NO.:  [id number]  MEDICAL RECORD NO.:  [id number]  LOCATION:  [id number]                        FACILITY:  [facility]  PHYSICIAN:  Mark C. Lorin Mercy, M.D.    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  PROCEDURE:  C3-C4, anterior cervical diskectomy and fusion, allograft and plate.  SURGEON:  Mark C. Lorin Mercy, M.D.  ASSISTANT:  Phillips Hay, PA-C, medically necessary and present for the entire procedure.  ESTIMATED BLOOD LOSS:  Minimal.  TOURNIQUET:  Less than 100 mL.  DRAINS:  1 Hemovac neck.  FINDINGS:  Large C3-4 HNP with compression.  INDICATIONS:  This is a 58 year old male who is deaf uses glasses for reading has had persistent problems with neck pain, shoulder pain, arm weakness, some tingling in his legs, which has progressed with central stenosis at C3-4 due to a disk protrusion with associated osteophytes and trace cord edema without significant myelopathic changes in his cord.  He also has some foraminal stenosis at C6-7 which is moderately severe, but has been asymptomatic.  He has had persistent pain in his neck, scapula, shoulder blades, and his arms.  Numbness and tingling also involving his legs and has failed conservative treatment including therapy traction, Medrol dose pack, observation.  DESCRIPTION OF PROCEDURE:  After induction general anesthesia, the interpreter was present for giving patient instructions prior to induction.  The patient was intubated and head halter traction applied. The yellow foam pads and the ulnar nerve.  Neck was prepped with DuraPrep.  After head halter traction was applied without weight.  Area was squared with towels, sterile skin marker, and prominent cephalad skin fold on  the left side, __________ Betadine Steri-Drapes sterile Mayo stand at the head, thyroid, sheets, and drapes.  Time-out procedure was completed.  Ancef was given prophylactically.  Incision was made at the midline extended left.  Blunt dissection angling cephalad was performed.  Superior thyroid vessel was carefully mobilized and preserved.  Carotid sheath and contents were lateral.  At the midline longus colli muscles were split off.  Some prominent veins and the longus coli were coagulated with bipolar.  Initially, short 25-needle was placed first this visualized, which was C5-6 and then jumping back over the superior via thyroid vessel, 3-4 level was identified, short 25 needle and straight clamp placed and then confirmed with lateral C-arm that this was appropriate level.  The large chunks of disk was cut out for documentation level and then __________ blades were placed right and left, smooth blades up and down.  Operative microscope was draped, brought in, and diskectomy was performed.  There was overhanging spurs from C3.  They came across the disk space overhanging edge of C4.  Once these were taken down with microdissection using the bur and then removing the posterior aspect spur with 1 and 2 mm Kerrisons microdissection, black nerve hook, chunks of disks were removed.  There was some epidural veins were prominence controlled with thrombin-soaked Gelfoam and  a small half by half patties.  Complete decompression across.  Wide enough for the 6 trialed easily fit in was performed. Uncovertebral joints were decompressed.  Stripped with Cloward curettes. An particular attention paid to the left side, which was slightly tighter than the right with narrowing of the canal below 6 mm with other levels set 11-12 mm.  Once the dura was completely decompressed as visualized the bulging back into the space between C3 and C4.  Careful probing around the edges with the a black nerve hook  showed no remaining disk material and all been maintained by the ligament with the spur present.  No extruded fragments were found.  Trial sizes showed 7 gave a nice fit.  CRNA applied traction and graft was countersunk 2 mm, 14 mm screws were placed above and below, and checked under C-arm good position alignment.  After irrigation with saline solution, Hemovac drain was placed in and out technique on the left side and in line with the skin incision with 3-0 Vicryl on the platysma 4-0 Vicryl subcuticular closure.  Tincture of benzoin, Steri-Strips, 4 x 4 tape, and soft cervical collar.  Instrument count and needle count was correct.  The patient was transferred to recovery room with neurologically intact.     Mark C. Lorin Mercy, M.D.     MCY/MEDQ  D:  08/11/2013  T:  08/12/2013  Job:  [id number]

## 2013-08-12 NOTE — Discharge Summary (Signed)
Patient ID: Antonio Carpenter MRN: [id number] DOB/AGE: 58/22/1957 58 y.o.  Admit date: 08/11/2013 Discharge date: 08/12/2013  Admission Diagnoses:  Principal Problem:   HNP (herniated nucleus pulposus), cervical   Discharge Diagnoses:  S/P ANTERIOR CERVICAL DECOMPRESSION/DISCECTOMY FUSION 1 LEVEL.  Past Medical History  Diagnosis Date  . Deafness congenital     Surgeries: Procedure(s): ANTERIOR CERVICAL DECOMPRESSION/DISCECTOMY FUSION 1 LEVEL on 08/11/2013   Consultants:  None  Discharged Condition: Improved  Hospital Course: Antonio Carpenter is an 58 y.o. male who was admitted 08/11/2013 for operative treatment ofHNP (herniated nucleus pulposus), cervical. Patient has severe unremitting pain that affects sleep, daily activities, and work/hobbies. After pre-op clearance the patient was taken to the operating room on 08/11/2013 and underwent  Procedure(s): ANTERIOR CERVICAL DECOMPRESSION/DISCECTOMY FUSION 1 LEVEL.    Patient was given perioperative antibiotics: Anti-infectives   Start     Dose/Rate Route Frequency Ordered Stop   08/11/13 0600  ceFAZolin (ANCEF) IVPB 2 g/50 mL premix     2 g 100 mL/hr over 30 Minutes Intravenous On call to O.R. 08/10/13 1433 08/11/13 1240       Patient was given sequential compression devices, early ambulation, and chemoprophylaxis to prevent DVT.  Patient benefited maximally from hospital stay and there were no complications.    Recent vital signs: Patient Vitals for the past 24 hrs:  BP Temp Temp src Pulse Resp SpO2 Height Weight  08/12/13 0630 132/76 mmHg 98 F (36.7 C) - 64 18 99 % - -  08/11/13 2125 - 98.7 F (37.1 C) - - - - - -  08/11/13 1540 131/75 mmHg 98.2 F (36.8 C) Oral 66 14 93 % - -  08/11/13 1515 135/85 mmHg 98.3 F (36.8 C) - 66 11 100 % - -  08/11/13 1500 134/81 mmHg - - 72 15 97 % - -  08/11/13 1445 128/67 mmHg 98.1 F (36.7 C) - 82 16 91 % - -  08/11/13 1443 - - - 81 15 91 % - -  08/11/13 1019 137/77 mmHg 98.1 F (36.7  C) Oral 64 18 98 % 5\' 1"  (1.549 m) 79.9 kg (176 lb 2.4 oz)     Recent laboratory studies: No results found for this basename: WBC, HGB, HCT, PLT, NA, K, CL, CO2, BUN, CREATININE, GLUCOSE, PT, INR, CALCIUM, 2,  in the last 72 hours   Discharge Medications:     Medication List         diclofenac 75 MG EC tablet  Commonly known as:  VOLTAREN  Take 75 mg by mouth 2 (two) times daily as needed for mild pain.     methocarbamol 500 MG tablet  Commonly known as:  ROBAXIN  Take 1 tablet (500 mg total) by mouth every 6 (six) hours as needed for muscle spasms (spasm).     OMEGA 3 PO  Take 1 tablet by mouth 2 (two) times a week.     oxyCODONE-acetaminophen 5-325 MG per tablet  Commonly known as:  ROXICET  Take 1-2 tablets by mouth every 4 (four) hours as needed.        Diagnostic Studies: Dg Chest 2 View  08/02/2013   CLINICAL DATA:  Tobacco use.  EXAM: CHEST  2 VIEW  COMPARISON:  None.  FINDINGS: The heart size and mediastinal contours are within normal limits. Both lungs are clear. The visualized skeletal structures are unremarkable.  IMPRESSION: No acute cardiopulmonary abnormality seen.   Electronically Signed   By: Dionne Ano.D.  On: 08/02/2013 13:56   Dg Cervical Spine 2-3 Views  08/11/2013   CLINICAL DATA:  Cervical fusion  EXAM: CERVICAL SPINE - 2-3 VIEW; DG C-ARM 1-60 MIN  COMPARISON:  None.  FINDINGS: Two fluoroscopic spot images document changes of instrumented ACDF C3-4. Lower cervical spine is not well visualized on the lateral image.  IMPRESSION: ACDF C3-4   Electronically Signed   By: Arne Cleveland M.D.   On: 08/11/2013 16:48   Dg C-arm 1-60 Min  08/11/2013   CLINICAL DATA:  Cervical fusion  EXAM: CERVICAL SPINE - 2-3 VIEW; DG C-ARM 1-60 MIN  COMPARISON:  None.  FINDINGS: Two fluoroscopic spot images document changes of instrumented ACDF C3-4. Lower cervical spine is not well visualized on the lateral image.  IMPRESSION: ACDF C3-4   Electronically Signed   By: Arne Cleveland M.D.   On: 08/11/2013 16:48    Disposition: Discharge to home        Follow-up Information   Follow up with [facility], MD. Schedule an appointment as soon as possible for a visit in 2 weeks.   Specialty:  Orthopedic Surgery   Contact information:   [place] [address] [phone]        Signed: Erskine Emery 08/12/2013, 9:58 AM

## 2013-12-27 ENCOUNTER — Encounter: Payer: Self-pay | Admitting: Gastroenterology

## 2013-12-29 ENCOUNTER — Telehealth: Payer: Self-pay | Admitting: Gastroenterology

## 2013-12-29 NOTE — Telephone Encounter (Signed)
Rec'd records form [facility] Forwarding 10 page's to Dr.Starks Norberto Sorenson

## 2014-02-08 ENCOUNTER — Ambulatory Visit (AMBULATORY_SURGERY_CENTER): Payer: Self-pay | Admitting: *Deleted

## 2014-02-08 VITALS — Ht 67.0 in | Wt 174.4 lb

## 2014-02-08 DIAGNOSIS — Z1211 Encounter for screening for malignant neoplasm of colon: Secondary | ICD-10-CM

## 2014-02-08 MED ORDER — MOVIPREP 100 G PO SOLR: ORAL | Status: DC

## 2014-02-08 NOTE — Progress Notes (Signed)
No allergies to eggs or soy. No problems with anesthesia.  No oxygen use  No diet drug use  

## 2014-02-22 ENCOUNTER — Encounter: Payer: Self-pay | Admitting: Gastroenterology

## 2014-02-22 ENCOUNTER — Ambulatory Visit (AMBULATORY_SURGERY_CENTER): Payer: BC Managed Care – PPO | Admitting: Gastroenterology

## 2014-02-22 VITALS — BP 136/75 | HR 57 | Temp 97.6°F | Resp 30

## 2014-02-22 DIAGNOSIS — D123 Benign neoplasm of transverse colon: Secondary | ICD-10-CM

## 2014-02-22 DIAGNOSIS — D125 Benign neoplasm of sigmoid colon: Secondary | ICD-10-CM

## 2014-02-22 DIAGNOSIS — Z1211 Encounter for screening for malignant neoplasm of colon: Secondary | ICD-10-CM

## 2014-02-22 HISTORY — PX: COLONOSCOPY: SHX174

## 2014-02-22 MED ORDER — SODIUM CHLORIDE 0.9 % IV SOLN: 500.0000 mL | INTRAVENOUS | Status: AC

## 2014-02-22 NOTE — Op Note (Signed)
[place]  [address] [place], [address]   COLONOSCOPY PROCEDURE REPORT PATIENT: Antonio Carpenter, Antonio Carpenter  MR#: [id number] BIRTHDATE: 01/19/1956 , 63  yrs. old GENDER: male ENDOSCOPIST: Ladene Artist, MD, [facility] REFERRED BY: Priscille Loveless, FNP PROCEDURE DATE:  02/22/2014 PROCEDURE:   Colonoscopy with biopsy and Colonoscopy with snare polypectomy First Screening Colonoscopy - Avg.  risk and is 50 yrs.  old or older Yes.  Prior Negative Screening - Now for repeat screening. N/A  History of Adenoma - Now for follow-up colonoscopy & has been > or = to 3 yrs.  N/A  Polyps Removed Today? Yes. ASA CLASS:   Class II INDICATIONS:average risk for colorectal cancer. MEDICATIONS: Monitored anesthesia care and Propofol 300 mg IV DESCRIPTION OF PROCEDURE:   After the risks benefits and alternatives of the procedure were thoroughly explained, informed consent was obtained.  The digital rectal exam revealed no abnormalities of the rectum.   The LB [id number]  endoscope was introduced through the anus and advanced to the cecum, which was identified by both the appendix and ileocecal valve. No adverse events experienced with a tortuous colon.   The quality of the prep was good, using MoviPrep  The instrument was then slowly withdrawn as the colon was fully examined.  COLON FINDINGS: A sessile polyp measuring 5 mm in size was found in the distal transverse colon.  A polypectomy was performed with a cold snare.  The resection was complete, the polyp tissue was completely retrieved and sent to histology.   A pedunculated polyp measuring 10 mm in size was found in the distal transverse colon. A polypectomy was performed using snare cautery.  The resection was complete, the polyp tissue was completely retrieved and sent to histology.   A sessile polyp measuring 5 mm in size was found in the sigmoid colon.  A polypectomy was performed with cold forceps. The resection was complete,  the polyp tissue was completely retrieved and sent to histology.  There was moderate diverticulosis noted in the descending colon, at the splenic flexure, and in the transverse colon.   The examination was otherwise normal. Retroflexed views revealed internal Grade II hemorrhoids. The time to cecum=9 minutes 03 seconds.  Withdrawal time=11 minutes 43 seconds.  The scope was withdrawn and the procedure completed. COMPLICATIONS: There were no immediate complications. ENDOSCOPIC IMPRESSION: 1.   Sessile polyp in the distal transverse colon; polypectomy performed with a cold snare 2.   Pedunculated polyp in the distal transverse colon; polypectomy performed using snare cautery 3.   Sessile polyp in the sigmoid colon; polypectomy performed with cold forceps 4.   Moderate diverticulosis in the descending colon, at the splenic flexure, and in the transverse colon 5.   Grade II internal hemorrhoids  RECOMMENDATIONS: 1.  Hold Aspirin and all other NSAIDS for 2 weeks. 2.  Await pathology results 3.  Repeat colonoscopy in 5 years if polyp(s) adenomatous; otherwise 10 years 4.  High fiber diet with liberal fluid intake.  eSigned:  Ladene Artist, MD, [facility] 02/22/2014 12:29 PM

## 2014-02-22 NOTE — Addendum Note (Signed)
Addended by: Laverna Peace on: 02/22/2014 04:46 PM   Modules accepted: Orders

## 2014-02-22 NOTE — Progress Notes (Signed)
D/C instructions reviewed with pt, interpreter and care partner.

## 2014-02-22 NOTE — Progress Notes (Signed)
  [facility] Post-op Note  Patient: Antonio Carpenter  Procedure(s) Performed: colonoscopy  Patient Location: [facility] - Recovery Area  Anesthesia Type: Deep Sedation/Propofol  Level of Consciousness: awake, oriented and patient cooperative  Airway and Oxygen Therapy: Patient Spontanous Breathing  Post-op Pain: none  Post-op Assessment:  Post-op Vital signs reviewed, Patient's Cardiovascular Status Stable, Respiratory Function Stable, Patent Airway, No signs of Nausea or vomiting and Pain level controlled  Post-op Vital Signs: Reviewed and stable  Complications: No apparent anesthesia complications  Manali Mcelmurry E 12:29 PM

## 2014-02-22 NOTE — Addendum Note (Signed)
Addended by: Randall Hiss B on: 02/22/2014 04:47 PM   Modules accepted: Orders, SmartSet

## 2014-02-22 NOTE — Patient Instructions (Addendum)
Discharge instructions given with verbal understanding. Handouts on polyps,diverticulosis and hemorrhoids Hold aspirin and all other NSAIDS for 2 weeks YOU HAD AN ENDOSCOPIC PROCEDURE TODAY AT [facility]: Refer to the procedure report that was given to you for any specific questions about what was found during the examination.  If the procedure report does not answer your questions, please call your gastroenterologist to clarify.  If you requested that your care partner not be given the details of your procedure findings, then the procedure report has been included in a sealed envelope for you to review at your convenience later.  YOU SHOULD EXPECT: Some feelings of bloating in the abdomen. Passage of more gas than usual.  Walking can help get rid of the air that was put into your GI tract during the procedure and reduce the bloating. If you had a lower endoscopy (such as a colonoscopy or flexible sigmoidoscopy) you may notice spotting of blood in your stool or on the toilet paper. If you underwent a bowel prep for your procedure, then you may not have a normal bowel movement for a few days.  DIET: Your first meal following the procedure should be a light meal and then it is ok to progress to your normal diet.  A half-sandwich or bowl of soup is an example of a good first meal.  Heavy or fried foods are harder to digest and may make you feel nauseous or bloated.  Likewise meals heavy in dairy and vegetables can cause extra gas to form and this can also increase the bloating.  Drink plenty of fluids but you should avoid alcoholic beverages for 24 hours.  ACTIVITY: Your care partner should take you home directly after the procedure.  You should plan to take it easy, moving slowly for the rest of the day.  You can resume normal activity the day after the procedure however you should NOT DRIVE or use heavy machinery for 24 hours (because of the sedation medicines used during the test).     SYMPTOMS TO REPORT IMMEDIATELY: A gastroenterologist can be reached at any hour.  During normal business hours, 8:30 AM to 5:00 PM Monday through Friday, call [phone].  After hours and on weekends, please call the GI answering service at [phone] who will take a message and have the physician on call contact you.   Following lower endoscopy (colonoscopy or flexible sigmoidoscopy):  Excessive amounts of blood in the stool  Significant tenderness or worsening of abdominal pains  Swelling of the abdomen that is new, acute  Fever of 100F or higher  FOLLOW UP: If any biopsies were taken you will be contacted by phone or by letter within the next 1-3 weeks.  Call your gastroenterologist if you have not heard about the biopsies in 3 weeks.  Our staff will call the home number listed on your records the next business day following your procedure to check on you and address any questions or concerns that you may have at that time regarding the information given to you following your procedure. This is a courtesy call and so if there is no answer at the home number and we have not heard from you through the emergency physician on call, we will assume that you have returned to your regular daily activities without incident.  SIGNATURES/CONFIDENTIALITY: You and/or your care partner have signed paperwork which will be entered into your electronic medical record.  These signatures attest to the fact that that the information  above on your After Visit Summary has been reviewed and is understood.  Full responsibility of the confidentiality of this discharge information lies with you and/or your care-partner. 

## 2014-02-22 NOTE — Progress Notes (Signed)
Called to room to assist during endoscopic procedure.  Patient ID and intended procedure confirmed with present staff. Received instructions for my participation in the procedure from the performing physician.  

## 2014-02-23 ENCOUNTER — Telehealth: Payer: Self-pay | Admitting: *Deleted

## 2014-02-23 NOTE — Telephone Encounter (Signed)
No answer, message left for the patient. 

## 2014-02-28 ENCOUNTER — Encounter: Payer: Self-pay | Admitting: Gastroenterology

## 2015-02-16 IMAGING — RF DG CERVICAL SPINE 2 OR 3 VIEWS
1 series · 2 of 2 positions shown · non-contrast
Comparison: None.

CLINICAL DATA: Cervical fusion

EXAM:
CERVICAL SPINE - 2-3 VIEW; DG C-ARM 1-60 MIN

[Series 1: run · 2 of 2 slices shown]
[im 1/2]
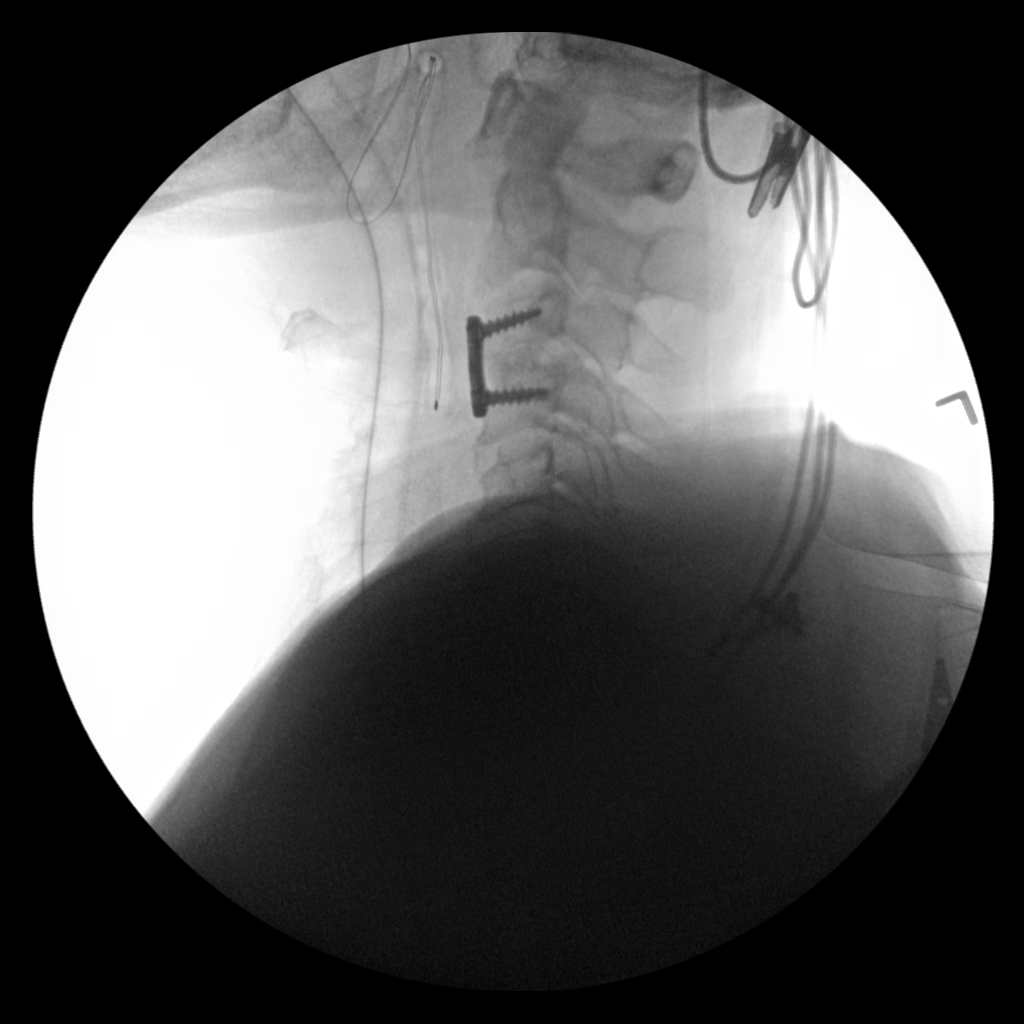
[im 2/2]
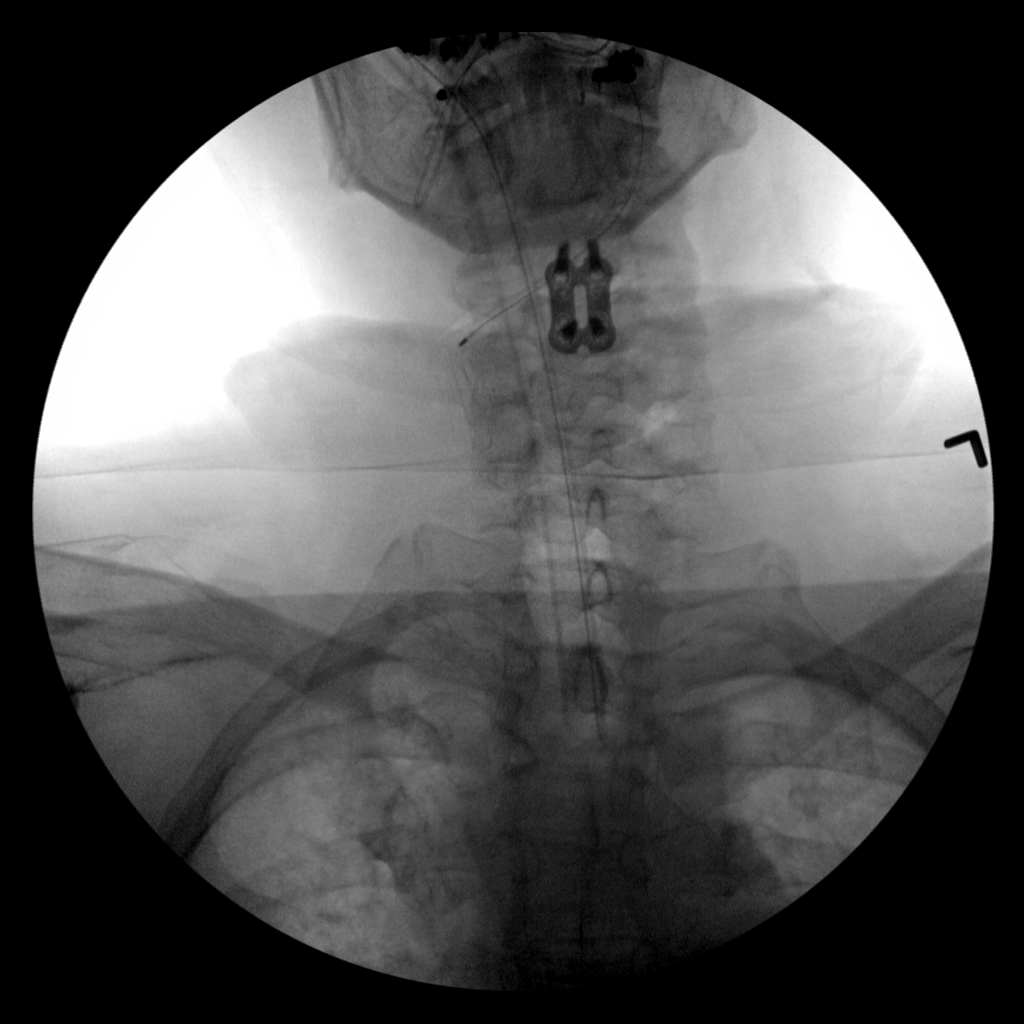

[2 of 2 positions shown; findings below may reference images not displayed]

FINDINGS: Two fluoroscopic spot images document changes of instrumented ACDF
C3-4. Lower cervical spine is not well visualized on the lateral
image.
IMPRESSION: ACDF C3-4

## 2019-01-13 ENCOUNTER — Encounter: Payer: Self-pay | Admitting: Gastroenterology

## 2019-11-10 ENCOUNTER — Encounter: Payer: Self-pay | Admitting: Gastroenterology

## 2019-12-30 ENCOUNTER — Ambulatory Visit (AMBULATORY_SURGERY_CENTER): Payer: Self-pay

## 2019-12-30 VITALS — Ht 67.0 in | Wt 172.0 lb

## 2019-12-30 DIAGNOSIS — Z86010 Personal history of colonic polyps: Secondary | ICD-10-CM

## 2019-12-30 MED ORDER — PLENVU 140 G PO SOLR: 1.0000 | Freq: Once | ORAL | 0 refills | Status: AC

## 2019-12-30 NOTE — Progress Notes (Signed)
No egg or soy allergy known to patient  No issues with past sedation with any surgeries or procedures no intubation problems in the past  No FH of Malignant Hyperthermia No diet pills per patient No home 02 use per patient  No blood thinners per patient  Pt denies issues with constipation  No A fib or A flutter  EMMI video to pt or via [place] guidelines implemented in PV today with Pt and RN   Sign Language Interpreter  Pt has been vaccinated for covid.   Plenvu  Coupon given to pt in PV today , Code to Pharmacy   Due to the COVID-19 pandemic we are asking patients to follow these guidelines. Please only bring one care partner. Please be aware that your care partner may wait in the car in the parking lot or if they feel like they will be too hot to wait in the car, they may wait in the lobby on the 4th floor. All care partners are required to wear a mask the entire time (we do not have any that we can provide them), they need to practice social distancing, and we will do a Covid check for all patient's and care partners when you arrive. Also we will check their temperature and your temperature. If the care partner waits in their car they need to stay in the parking lot the entire time and we will call them on their cell phone when the patient is ready for discharge so they can bring the car to the front of the building. Also all patient's will need to wear a mask into building.

## 2020-01-05 ENCOUNTER — Encounter: Payer: Self-pay | Admitting: Gastroenterology

## 2020-01-11 ENCOUNTER — Encounter: Payer: Self-pay | Admitting: Gastroenterology

## 2020-01-13 ENCOUNTER — Ambulatory Visit (AMBULATORY_SURGERY_CENTER): Payer: BC Managed Care – PPO | Admitting: Gastroenterology

## 2020-01-13 ENCOUNTER — Other Ambulatory Visit: Payer: Self-pay

## 2020-01-13 ENCOUNTER — Encounter: Payer: Self-pay | Admitting: Gastroenterology

## 2020-01-13 VITALS — BP 106/42 | HR 58 | Temp 97.3°F | Resp 13 | Ht 67.0 in | Wt 172.0 lb

## 2020-01-13 DIAGNOSIS — D125 Benign neoplasm of sigmoid colon: Secondary | ICD-10-CM

## 2020-01-13 DIAGNOSIS — Z86010 Personal history of colonic polyps: Secondary | ICD-10-CM

## 2020-01-13 DIAGNOSIS — D123 Benign neoplasm of transverse colon: Secondary | ICD-10-CM

## 2020-01-13 DIAGNOSIS — D128 Benign neoplasm of rectum: Secondary | ICD-10-CM

## 2020-01-13 DIAGNOSIS — Z860101 Personal history of adenomatous and serrated colon polyps: Secondary | ICD-10-CM

## 2020-01-13 MED ORDER — SODIUM CHLORIDE 0.9 % IV SOLN: 500.0000 mL | Freq: Once | INTRAVENOUS | Status: DC

## 2020-01-13 NOTE — Op Note (Signed)
[facility] Patient Name: Antonio Carpenter Procedure Date: 01/13/2020 11:05 AM MRN: [id number] Endoscopist: Ladene Artist , MD Age: 64 Referring MD:  Date of Birth: 1955/10/08 Gender: Male Account #: [id number] Procedure:                Colonoscopy Indications:              Surveillance: Personal history of adenomatous                            polyps on last colonoscopy > 5 years ago Medicines:                Monitored Anesthesia Care Procedure:                Pre-Anesthesia Assessment:                           - Prior to the procedure, a History and Physical                            was performed, and patient medications and                            allergies were reviewed. The patient's tolerance of                            previous anesthesia was also reviewed. The risks                            and benefits of the procedure and the sedation                            options and risks were discussed with the patient.                            All questions were answered, and informed consent                            was obtained. Prior Anticoagulants: The patient has                            taken no previous anticoagulant or antiplatelet                            agents. ASA Grade Assessment: III - A patient with                            severe systemic disease. After reviewing the risks                            and benefits, the patient was deemed in                            satisfactory condition to undergo the procedure.  After obtaining informed consent, the colonoscope                            was passed under direct vision. Throughout the                            procedure, the patient's blood pressure, pulse, and                            oxygen saturations were monitored continuously. The                            Colonoscope was introduced through the anus and                            advanced to the the  cecum, identified by                            appendiceal orifice and ileocecal valve. The                            ileocecal valve, appendiceal orifice, and rectum                            were photographed. The quality of the bowel                            preparation was good. The colonoscopy was performed                            without difficulty. The patient tolerated the                            procedure well. Scope In: 11:10:04 AM Scope Out: 11:26:32 AM Scope Withdrawal Time: 0 hours 10 minutes 44 seconds  Total Procedure Duration: 0 hours 16 minutes 28 seconds  Findings:                 The perianal and digital rectal examinations were                            normal except anal stenosis.                           A benign-appearing, intrinsic moderate stenosis                            measuring less than one cm (in length) was found at                            the anus and was traversed.                           Four sessile polyps were found in the rectum (1),  sigmoid colon (2) and transverse colon (1). The                            polyps were 3 to 5 mm in size. These polyps were                            removed with a cold biopsy forceps. Resection and                            retrieval were complete.                           Multiple medium-mouthed diverticula were found in                            the sigmoid colon, descending colon and transverse                            colon. There was no evidence of diverticular                            bleeding.                           Internal hemorrhoids were found during                            retroflexion. The hemorrhoids were small and Grade                            I (internal hemorrhoids that do not prolapse).                           The exam was otherwise without abnormality on                            direct and retroflexion views. Complications:             No immediate complications. Estimated blood loss:                            None. Estimated Blood Loss:     Estimated blood loss: none. 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                           - Four 3 to 5 mm polyps in the rectum, in the                            sigmoid colon and in the transverse colon, removed                            with a cold biopsy forceps. Resected and retrieved.                           -  Moderate diverticulosis in the sigmoid colon, in                            the descending colon and in the transverse colon.                           - Internal hemorrhoids.                           - The examination was otherwise normal on direct                            and retroflexion views. Recommendation:           - Repeat colonoscopy after studies are complete for                            surveillance based on pathology results.                           - Patient has a contact number available for                            emergencies. The signs and symptoms of potential                            delayed complications were discussed with the                            patient. Return to normal activities tomorrow.                            Written discharge instructions were provided to the                            patient.                           - High fiber diet.                           - Continue present medications.                           - Await pathology results. Ladene Artist, MD 01/13/2020 11:39:43 AM This report has been signed electronically.

## 2020-01-13 NOTE — Progress Notes (Signed)
A and O x3. Report to RN. Tolerated MAC anesthesia well. 

## 2020-01-13 NOTE — Patient Instructions (Signed)
Handouts given on polyps, diverticulosis, hemorrhoids and high fiber diet.  Await for pathology on x 4 polyps removed.  YOU HAD AN ENDOSCOPIC PROCEDURE TODAY AT [facility]:   Refer to the procedure report that was given to you for any specific questions about what was found during the examination.  If the procedure report does not answer your questions, please call your gastroenterologist to clarify.  If you requested that your care partner not be given the details of your procedure findings, then the procedure report has been included in a sealed envelope for you to review at your convenience later.  YOU SHOULD EXPECT: Some feelings of bloating in the abdomen. Passage of more gas than usual.  Walking can help get rid of the air that was put into your GI tract during the procedure and reduce the bloating. If you had a lower endoscopy (such as a colonoscopy or flexible sigmoidoscopy) you may notice spotting of blood in your stool or on the toilet paper. If you underwent a bowel prep for your procedure, you may not have a normal bowel movement for a few days.  Please Note:  You might notice some irritation and congestion in your nose or some drainage.  This is from the oxygen used during your procedure.  There is no need for concern and it should clear up in a day or so.  SYMPTOMS TO REPORT IMMEDIATELY:   Following lower endoscopy (colonoscopy or flexible sigmoidoscopy):  Excessive amounts of blood in the stool  Significant tenderness or worsening of abdominal pains  Swelling of the abdomen that is new, acute  Fever of 100F or higher   For urgent or emergent issues, a gastroenterologist can be reached at any hour by calling [phone]. Do not use MyChart messaging for urgent concerns.    DIET:  We do recommend a small meal at first, but then you may proceed to your regular diet.  Drink plenty of fluids but you should avoid alcoholic beverages for 24  hours.  ACTIVITY:  You should plan to take it easy for the rest of today and you should NOT DRIVE or use heavy machinery until tomorrow (because of the sedation medicines used during the test).    FOLLOW UP: Our staff will call the number listed on your records 48-72 hours following your procedure to check on you and address any questions or concerns that you may have regarding the information given to you following your procedure. If we do not reach you, we will leave a message.  We will attempt to reach you two times.  During this call, we will ask if you have developed any symptoms of COVID 19. If you develop any symptoms (ie: fever, flu-like symptoms, shortness of breath, cough etc.) before then, please call [phone].  If you test positive for Covid 19 in the 2 weeks post procedure, please call and report this information to [place].    If any biopsies were taken you will be contacted by phone or by letter within the next 1-3 weeks.  Please call us at [phone] if you have not heard about the biopsies in 3 weeks.    SIGNATURES/CONFIDENTIALITY: You and/or your care partner have signed paperwork which will be entered into your electronic medical record.  These signatures attest to the fact that that the information above on your After Visit Summary has been reviewed and is understood.  Full responsibility of the confidentiality of this discharge information lies with you  and/or your care-partner. 

## 2020-01-13 NOTE — Progress Notes (Signed)
Pt's states no medical or surgical changes since previsit or office visit. 

## 2020-01-17 ENCOUNTER — Telehealth: Payer: Self-pay

## 2020-01-17 NOTE — Telephone Encounter (Signed)
Pt communicates by sign language. No answer. Left message on machine per request.

## 2020-01-20 ENCOUNTER — Encounter: Payer: Self-pay | Admitting: Gastroenterology

## 2020-08-24 ENCOUNTER — Other Ambulatory Visit: Payer: Self-pay

## 2020-08-24 ENCOUNTER — Encounter (HOSPITAL_COMMUNITY): Payer: Self-pay

## 2020-08-24 ENCOUNTER — Ambulatory Visit (HOSPITAL_COMMUNITY)
Admission: EM | Admit: 2020-08-24 | Discharge: 2020-08-24 | Disposition: A | Discharge status: Home / Self Care | Payer: BC Managed Care – PPO | Attending: Physician Assistant | Admitting: Physician Assistant

## 2020-08-24 DIAGNOSIS — J329 Chronic sinusitis, unspecified: Secondary | ICD-10-CM

## 2020-08-24 DIAGNOSIS — R058 Other specified cough: Secondary | ICD-10-CM | POA: Diagnosis not present

## 2020-08-24 DIAGNOSIS — J40 Bronchitis, not specified as acute or chronic: Secondary | ICD-10-CM | POA: Diagnosis not present

## 2020-08-24 DIAGNOSIS — F17200 Nicotine dependence, unspecified, uncomplicated: Secondary | ICD-10-CM

## 2020-08-24 MED ORDER — ALBUTEROL SULFATE HFA 108 (90 BASE) MCG/ACT IN AERS: 1.0000 | INHALATION_SPRAY | Freq: Four times a day (QID) | RESPIRATORY_TRACT | 0 refills | Status: AC | PRN

## 2020-08-24 MED ORDER — DOXYCYCLINE HYCLATE 100 MG PO CAPS: 100.0000 mg | ORAL_CAPSULE | Freq: Two times a day (BID) | ORAL | 0 refills | Status: AC

## 2020-08-24 NOTE — ED Triage Notes (Signed)
Pt c/o a cough and is requesting for medication to relieve the cough. He states the cough has been going on for 1 month. Pt states over night if the [facility] is on he will start to cough.

## 2020-08-24 NOTE — Discharge Instructions (Signed)
Take doxycycline twice daily for 10 days.  Stay out of the sun while on this medication as it can cause you to have a very severe sunburn.  Use albuterol inhaler every 4-6 hours as needed for shortness of breath.  Use Mucinex and Flonase for additional symptom relief.  Make sure you are drinking plenty of fluid.  If you have worsening symptoms please return for reevaluation or if your symptoms do not improve with this medication.

## 2020-08-24 NOTE — ED Provider Notes (Signed)
[facility]    CSN: [id number] Arrival date & time: 08/24/20  1250      History   Chief Complaint No chief complaint on file.   HPI Antonio Carpenter is a 65 y.o. male.   Patient requires American sign language interpreter which was utilized during this visit.  He reports a 1 month history of productive cough.  Reports some nasal congestion but denies additional symptoms including fever, shortness of breath, chest pain, nausea, vomiting, body aches.  He has tried cough drops without improvement of symptoms.  Denies any known sick contacts.  He has not had his flu shot but is up-to-date on COVID-19 vaccinations including booster.  He denies any recent antibiotic use.  He is a current everyday smoker but denies history of asthma or COPD.  He describes cough as thick colored sputum.  He has no additional complaints or concerns today.     Past Medical History:  Diagnosis Date  . Arthritis   . Deafness congenital   . Hyperlipidemia     Patient Active Problem List   Diagnosis Date Noted  . HNP (herniated nucleus pulposus), cervical 08/11/2013    Past Surgical History:  Procedure Laterality Date  . ANTERIOR CERVICAL DECOMP/DISCECTOMY FUSION N/A 08/11/2013   Procedure: ANTERIOR CERVICAL DECOMPRESSION/DISCECTOMY FUSION 1 LEVEL;  Surgeon: Marybelle Killings, MD;  Location: [facility];  Service: Orthopedics;  Laterality: N/A;  C3-4 Anterior Cervical Discectomy and Fusion, Allograft and Plate  . APPENDECTOMY  2000  . COLONOSCOPY  02/22/2014   MS       Home Medications    Prior to Admission medications   Medication Sig Start Date End Date Taking? Authorizing Provider  doxycycline (VIBRAMYCIN) 100 MG capsule Take 1 capsule (100 mg total) by mouth 2 (two) times daily. 08/24/20  Yes David Towson K, PA-C  albuterol (VENTOLIN HFA) 108 (90 Base) MCG/ACT inhaler Inhale 1-2 puffs into the lungs every 6 (six) hours as needed for wheezing or shortness of breath. 08/24/20  Yes Craig Ionescu K,  PA-C  atorvastatin (LIPITOR) 20 MG tablet Take 20 mg by mouth daily.     [provider]  diclofenac (VOLTAREN) 75 MG EC tablet Take 75 mg by mouth 2 (two) times daily as needed for mild pain.    [provider]  methocarbamol (ROBAXIN) 500 MG tablet Take 1 tablet (500 mg total) by mouth every 6 (six) hours as needed for muscle spasms (spasm). 08/11/13   Phillips Hay, PA-C  Omega-3 Fatty Acids (OMEGA 3 PO) Take 1 tablet by mouth 2 (two) times a week.     [provider]  oxyCODONE-acetaminophen (ROXICET) 5-325 MG per tablet Take 1-2 tablets by mouth every 4 (four) hours as needed. 08/11/13   Phillips Hay, PA-C    Family History Family History  Problem Relation Age of Onset  . Colon cancer Neg Hx   . Colon polyps Neg Hx   . Esophageal cancer Neg Hx   . Rectal cancer Neg Hx   . Stomach cancer Neg Hx     Social History Social History   Tobacco Use  . Smoking status: Current Every Day Smoker    Packs/day: 0.50    Years: 30.00    Pack years: 15.00    Types: Cigarettes  . Smokeless tobacco: Never Used  Vaping Use  . Vaping Use: Never used  Substance Use Topics  . Alcohol use: Yes    Comment: rare  . Drug use: No  Allergies   Patient has no known allergies.   Review of Systems Review of Systems  Constitutional: Positive for activity change. Negative for appetite change, fatigue and fever.  HENT: Positive for congestion. Negative for sinus pressure, sneezing and sore throat.   Respiratory: Positive for cough. Negative for shortness of breath.   Cardiovascular: Negative for chest pain.  Gastrointestinal: Negative for abdominal pain, diarrhea, nausea and vomiting.  Musculoskeletal: Negative for arthralgias and myalgias.  Neurological: Negative for dizziness, light-headedness and headaches.     Physical Exam Triage Vital Signs ED Triage Vitals  Enc Vitals Group     BP 08/24/20 1338 125/80     Pulse Rate 08/24/20 1338 75     Resp 08/24/20  1338 17     Temp 08/24/20 1338 98.1 F (36.7 C)     Temp Source 08/24/20 1338 Oral     SpO2 08/24/20 1338 97 %     Weight --      Height --      Head Circumference --      Peak Flow --      Pain Score 08/24/20 1336 0     Pain Loc --      Pain Edu? --      Excl. in [place]? --    No data found.  Updated Vital Signs BP 125/80 (BP Location: Right Arm)   Pulse 75   Temp 98.1 F (36.7 C) (Oral)   Resp 17   SpO2 97%   Visual Acuity Right Eye Distance:   Left Eye Distance:   Bilateral Distance:    Right Eye Near:   Left Eye Near:    Bilateral Near:     Physical Exam Vitals reviewed.  Constitutional:      General: He is awake.     Appearance: Normal appearance. He is normal weight. He is not ill-appearing.     Comments: Very pleasant male appears stated age in no acute distress  HENT:     Head: Normocephalic and atraumatic.     Right Ear: Tympanic membrane, ear canal and external ear normal. Tympanic membrane is not erythematous or bulging.     Left Ear: Tympanic membrane, ear canal and external ear normal. Tympanic membrane is not erythematous or bulging.     Nose: Nose normal.     Right Sinus: No maxillary sinus tenderness or frontal sinus tenderness.     Left Sinus: No maxillary sinus tenderness or frontal sinus tenderness.     Mouth/Throat:     Pharynx: Uvula midline. No oropharyngeal exudate or posterior oropharyngeal erythema.     Comments: Drainage present posterior oropharynx Cardiovascular:     Rate and Rhythm: Normal rate and regular rhythm.     Heart sounds: No murmur heard.   Pulmonary:     Effort: Pulmonary effort is normal. No accessory muscle usage or respiratory distress.     Breath sounds: No stridor. Wheezing present. No rhonchi or rales.     Comments: Scattered wheezing throughout lung fields. Abdominal:     General: Bowel sounds are normal.     Palpations: Abdomen is soft.     Tenderness: There is no abdominal tenderness.  Lymphadenopathy:      Head:     Right side of head: No submental, submandibular or tonsillar adenopathy.     Left side of head: No submental, submandibular or tonsillar adenopathy.     Cervical: No cervical adenopathy.  Neurological:     Mental Status: He is alert.  Psychiatric:        Behavior: Behavior is cooperative.      UC Treatments / Results  Labs (all labs ordered are listed, but only abnormal results are displayed) Labs Reviewed - No data to display  EKG   Radiology No results found.  Procedures Procedures (including critical care time)  Medications Ordered in UC Medications - No data to display  Initial Impression / Assessment and Plan / UC Course  I have reviewed the triage vital signs and the nursing notes.  Pertinent labs & imaging results that were available during my care of the patient were reviewed by me and considered in my medical decision making (see chart for details).      No indication for flu or COVID-19 testing given patient has been symptomatic for 1 month.  Concern for COPD exacerbation versus sinobronchitis.  Patient started on doxycycline 100 mg twice daily for 10 days with instruction to avoid prolonged sun exposure due to photosensitivity associated with this medication.  He was encouraged to use Mucinex and Flonase for additional symptom relief.  He was prescribed albuterol to be used as needed for coughing fits and/or shortness of breath with instruction on how to use this medication during visit today.  Discussed that if symptoms do not improve or if he has any worsening symptoms he needs to be reevaluated.  Strict return precautions given to which patient expressed understanding.  Final Clinical Impressions(s) / UC Diagnoses   Final diagnoses:  Sinobronchitis  Productive cough  Smoker     Discharge Instructions     Take doxycycline twice daily for 10 days.  Stay out of the sun while on this medication as it can cause you to have a very severe sunburn.   Use albuterol inhaler every 4-6 hours as needed for shortness of breath.  Use Mucinex and Flonase for additional symptom relief.  Make sure you are drinking plenty of fluid.  If you have worsening symptoms please return for reevaluation or if your symptoms do not improve with this medication.    ED Prescriptions    Medication Sig Dispense Auth. Provider   doxycycline (VIBRAMYCIN) 100 MG capsule Take 1 capsule (100 mg total) by mouth 2 (two) times daily. 20 capsule Annalyse Langlais K, PA-C   albuterol (VENTOLIN HFA) 108 (90 Base) MCG/ACT inhaler Inhale 1-2 puffs into the lungs every 6 (six) hours as needed for wheezing or shortness of breath. 8 g Shaely Gadberry, Derry Skill, PA-C     PDMP not reviewed this encounter.   Terrilee Croak, PA-C 08/24/20 1401

## 2023-03-07 ENCOUNTER — Encounter: Payer: Self-pay | Admitting: Gastroenterology
